# Patient Record
Sex: Female | Born: 1960 | Race: Black or African American | Hispanic: No | Marital: Single
Health system: Southern US, Community
[De-identification: ages and names within clinical notes are randomized; demographics above are authoritative.]

---

## 2001-03-30 ENCOUNTER — Encounter: Payer: Self-pay | Admitting: Family Medicine

## 2001-03-30 ENCOUNTER — Ambulatory Visit (HOSPITAL_COMMUNITY): Admission: RE | Admit: 2001-03-30 | Discharge: 2001-03-30 | Payer: Self-pay | Admitting: *Deleted

## 2001-06-07 ENCOUNTER — Inpatient Hospital Stay (HOSPITAL_COMMUNITY): Admission: AD | Admit: 2001-06-07 | Discharge: 2001-06-09 | Payer: Self-pay | Admitting: Family Medicine

## 2017-05-19 ENCOUNTER — Other Ambulatory Visit: Payer: Self-pay

## 2017-05-19 ENCOUNTER — Encounter: Payer: Self-pay | Admitting: Physical Therapy

## 2017-05-19 ENCOUNTER — Ambulatory Visit: Payer: Worker's Compensation | Attending: Specialist | Admitting: Physical Therapy

## 2017-05-19 DIAGNOSIS — R293 Abnormal posture: Secondary | ICD-10-CM

## 2017-05-19 DIAGNOSIS — M542 Cervicalgia: Secondary | ICD-10-CM | POA: Insufficient documentation

## 2017-05-19 NOTE — Therapy (Signed)
[facility] [address] [place], [place], [address] Phone: [phone]   Fax:  [phone]  Physical Therapy Evaluation  Patient Details  Name: Janice Davis MRN: [id number] Date of Birth: 1961-04-06 Referring Provider: Jene Every MD   Encounter Date: 05/19/2017  PT End of Session - 05/19/17 1123    Visit Number  1    Number of Visits  12    Date for PT Re-Evaluation  06/30/17    PT Start Time  1030    PT Stop Time  1103    PT Time Calculation (min)  33 min       History reviewed. No pertinent past medical history.  History reviewed. No pertinent surgical history.  There were no vitals filed for this visit.   Subjective Assessment - 05/19/17 1126    Subjective  The patient reports she was injured on the job on 01/10/17 when she was pushing a tube into a machine.  She states she was switched to another department and sustained a "re-injury" when she trhrew her arms up when rolling tubes.  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  She states this really exacerbated her pain-level and she has not really recovered since then.  She had some physical therapy and states it helped some.  She liked ultrasound but did not like electrical stimulation.  She has a TENS unit.  She is now on Gabapentin and this has been helpful.  Her resting pain-level is a 6/10 todat.  However, if she uses her left UE much her pain-level rises to a 10+/10.    Diagnostic tests  MRI:  Multiple level central stenosis.    Currently in Pain?  Yes    Pain Score  6     Pain Location  Neck    Pain Orientation  Left    Pain Descriptors / Indicators  Aching;Sharp    Pain Radiating Towards  Left shoulder.    Pain Onset  More than a month ago    Pain Frequency  Constant    Aggravating Factors   Left UE movement.    Pain Relieving Factors  Gabapentin.         [facility] PT Assessment - 05/19/17 0001       Assessment   Medical Diagnosis  Cervical Radiculopathy.    Referring Provider  Jene Every MD    Onset Date/Surgical Date  -- 01/10/17.    Hand Dominance  -- Per patient she is amidextrous      Precautions   Precautions  None      Restrictions   Weight Bearing Restrictions  No      Balance Screen   Has the patient fallen in the past 6 months  No    Has the patient had a decrease in activity level because of a fear of falling?   No    Is the patient reluctant to leave their home because of a fear of falling?   No      Home Environment   Living Environment  Private residence      Prior Function   Level of Independence  Independent      Posture/Postural Control   Posture/Postural Control  Postural limitations    Postural Limitations  Rounded Shoulders;Forward head    Posture Comments  Decreased cervical lordosis.      ROM / Strength   AROM / PROM / Strength  AROM;Strength      AROM   Overall AROM Comments  Right active cervcial rotation=48 degrees and left= 32 degrees.      Strength   Overall Strength Comments  Left ER painful and grossly graded at 4/5.      Palpation   Palpation comment  Tender just left of C5 over paraspinal musculature and very tender to palpation over the left UTeven to light palpation which is remarkable for a trigger point.      Special Tests    Special Tests  -- Normal bilateral UE DTR's.      Ambulation/Gait   Gait Comments  WNL.             Objective measurements completed on examination: See above findings.                   PT Long Term Goals - 05/19/17 1316      PT LONG TERM GOAL #1   Title  Independent with a HEP.    Time  6    Period  Weeks    Status  New      PT LONG TERM GOAL #2   Title  Increase active cervical rotation to 70 degrees+ so patient can turn head more easily while driving.    Time  6    Period  Weeks    Status  New      PT LONG TERM GOAL #3   Title  Perform ADL's with pain not >  2-3/10.    Time  6    Period  Weeks    Status  New             Plan - 05/19/17 1259    Clinical Impression Statement  The patient was orginally injured on the job on 01/10/17.  She then had 2 other incidences that caused further pain.  She has significant losses of cervical range of motion and is very to palpation at mid-cervical and left UT which is remarkable for a trigger point.  She also had reproducible pain with resisted left shoulder ER.  She is not able to return to work at this time.  patient will benefit from skilled physical therapy to include work simulation.    Clinical Presentation  Evolving    Clinical Presentation due to:  Not improving significantly.    Clinical Decision Making  Low    Rehab Potential  Good    PT Frequency  2x / week    PT Treatment/Interventions  ADLs/Self Care Home Management;Cryotherapy;Moist Heat;Ultrasound;Therapeutic exercise;Therapeutic activities;Patient/family education;Passive range of motion;Manual techniques;Dry needling    PT Next Visit Plan  U/S; gentle STW/M, active cervical range of motion; work simulation activites.      Consulted and Agree with Plan of Care  Patient       Patient will benefit from skilled therapeutic intervention in order to improve the following deficits and impairments:  Pain, Postural dysfunction, Decreased activity tolerance, Decreased range of motion, Decreased strength  Visit Diagnosis: Cervicalgia - Plan: PT plan of care cert/re-cert  Abnormal posture - Plan: PT plan of care cert/re-cert     Problem List There are no active problems to display for this patient.   Camar Guyton, [place]AD  MPT 05/19/2017, 1:19 PM  [facility] [address] [place], [place], [address] Phone: [phone]   Fax:  [phone]  Name: Janice Davis MRN: [id number] Date of Birth: 12-May-1960

## 2017-05-27 ENCOUNTER — Encounter: Payer: Self-pay | Admitting: Physical Therapy

## 2017-05-27 ENCOUNTER — Ambulatory Visit: Payer: Worker's Compensation | Attending: Specialist | Admitting: Physical Therapy

## 2017-05-27 DIAGNOSIS — R293 Abnormal posture: Secondary | ICD-10-CM

## 2017-05-27 DIAGNOSIS — M542 Cervicalgia: Secondary | ICD-10-CM | POA: Diagnosis present

## 2017-05-27 NOTE — Therapy (Signed)
[facility] [address] [place], [place], [address] Phone: [phone]   Fax:  [phone]  Physical Therapy Treatment  Patient Details  Name: Janice Davis MRN: [id number] Date of Birth: 01/26/1961 Referring Provider: Jene EveryJeffrey Beane MD   Encounter Date: 05/27/2017  PT End of Session - 05/27/17 0806    Visit Number  2    Number of Visits  12    Date for PT Re-Evaluation  06/30/17    PT Start Time  0730    PT Stop Time  0809    PT Time Calculation (min)  39 min    Activity Tolerance  Patient tolerated treatment well;Patient limited by pain    Behavior During Therapy  [facility] for tasks assessed/performed       History reviewed. No pertinent past medical history.  History reviewed. No pertinent surgical history.  There were no vitals filed for this visit.  Subjective Assessment - 05/27/17 0731    Subjective  Patient arrived with increased pain today and worked 12 hour shift    Diagnostic tests  MRI:  Multiple level central stenosis.    Currently in Pain?  Yes    Pain Score  10-Worst pain ever    Pain Location  Neck    Pain Orientation  Left    Pain Descriptors / Indicators  Aching;Sharp    Pain Type  Acute pain    Pain Onset  More than a month ago    Pain Frequency  Constant    Aggravating Factors   certain movements and work    Pain Relieving Factors  meds /heat                      [facility] Adult PT Treatment/Exercise - 05/27/17 0001      Modalities   Modalities  Ultrasound      Ultrasound   Ultrasound Location  bil cervical paraspnal and UT    Ultrasound Parameters  1.5w/cm2/50%/721mhz x5112min    Ultrasound Goals  Pain      Manual Therapy   Manual Therapy  Myofascial release;Soft tissue mobilization    Manual therapy comments  gentle manual STW and myofascial release to bil cervical paraspinals/UT/scapula                  PT Long Term Goals - 05/27/17 0809      PT LONG TERM GOAL #1   Title   Independent with a HEP.    Time  6    Period  Weeks    Status  On-going      PT LONG TERM GOAL #2   Title  Increase active cervical rotation to 70 degrees+ so patient can turn head more easily while driving.    Time  6    Period  Weeks    Status  On-going      PT LONG TERM GOAL #3   Title  Perform ADL's with pain not > 2-3/10.    Time  6    Period  Weeks    Status  On-going            Plan - 05/27/17 0809    Clinical Impression Statement  Patient tolerated fair today due to increased pain per reported. Patient reported working a 12 hour shift at work and unable to take her meds due to side effects. Patient limited with manual STW today and had reported palpable pain on bil cervical paraspinals into UT and down scapula  on bil sides. Patient reported feeling better after treatment. Current goals ongoing. Patient reported having therapy at another clinic yet was transferred here to try to decrease pain and increase function for ADL's, work and functional independence.     Rehab Potential  Good    PT Frequency  2x / week    PT Treatment/Interventions  ADLs/Self Care Home Management;Cryotherapy;Moist Heat;Ultrasound;Therapeutic exercise;Therapeutic activities;Patient/family education;Passive range of motion;Manual techniques;Dry needling    PT Next Visit Plan  cont with U/S; gentle STW/M, active cervical range of motion; work simulation activites.      Consulted and Agree with Plan of Care  Patient       Patient will benefit from skilled therapeutic intervention in order to improve the following deficits and impairments:  Pain, Postural dysfunction, Decreased activity tolerance, Decreased range of motion, Decreased strength  Visit Diagnosis: Cervicalgia  Abnormal posture     Problem List There are no active problems to display for this patient.   Hermelinda Dellen, PTA 05/27/2017, 8:19 AM  [facility] [address] [place], [place], [address] Phone: [phone]   Fax:  [phone]  Name: Janice Davis MRN: [id number] Date of Birth: Aug 14, 1960

## 2017-05-28 ENCOUNTER — Ambulatory Visit: Payer: Worker's Compensation | Admitting: Physical Therapy

## 2017-05-28 ENCOUNTER — Encounter: Payer: Self-pay | Admitting: Physical Therapy

## 2017-05-28 DIAGNOSIS — R293 Abnormal posture: Secondary | ICD-10-CM

## 2017-05-28 DIAGNOSIS — M542 Cervicalgia: Secondary | ICD-10-CM

## 2017-05-28 NOTE — Therapy (Signed)
[facility] [address] [place], [place], [address] Phone: [phone]   Fax:  [phone]  Physical Therapy Treatment  Patient Details  Name: Janice Davis MRN: [id number] Date of Birth: 01/31/1961 Referring Provider: Jene EveryJeffrey Beane MD   Encounter Date: 05/28/2017  PT End of Session - 05/28/17 0810    Visit Number  3    Number of Visits  12    Date for PT Re-Evaluation  06/30/17    PT Start Time  0731    PT Stop Time  0802    PT Time Calculation (min)  31 min    Activity Tolerance  Patient limited by pain;Patient tolerated treatment well    Behavior During Therapy  [facility] for tasks assessed/performed       History reviewed. No pertinent past medical history.  History reviewed. No pertinent surgical history.  There were no vitals filed for this visit.  Subjective Assessment - 05/28/17 0736    Subjective  Patient arrived and took medication and reported feeling better and less discomfort    Diagnostic tests  MRI:  Multiple level central stenosis.    Currently in Pain?  Yes    Pain Score  3     Pain Location  Neck    Pain Orientation  Left    Pain Descriptors / Indicators  Other (Comment) pinching    Pain Type  Acute pain    Pain Onset  More than a month ago    Pain Frequency  Constant    Aggravating Factors   work activities/use with left shoulder    Pain Relieving Factors  medication/rest                      [facility] Adult PT Treatment/Exercise - 05/28/17 0001      Exercises   Exercises  Shoulder      Shoulder Exercises: Standing   External Rotation  Strengthening;Left;20 reps;Theraband    Theraband Level (Shoulder External Rotation)  Level 1 (Yellow)    Other Standing Exercises  cone stacking x4 to waist over head      Shoulder Exercises: ROM/Strengthening   UBE (Upper Arm Bike)  x356min @ 120 RPM    Other ROM/Strengthening Exercises  Yellow thera putty given      Ultrasound   Ultrasound Location  bil  cervical paraspinals/UT    Ultrasound Parameters  1.5w/cm2/50%/841mhz x4512min    Ultrasound Goals  Pain      Manual Therapy   Manual Therapy  Myofascial release;Soft tissue mobilization    Manual therapy comments  attempted STW yet patient unable to tolerate x784min                  PT Long Term Goals - 05/27/17 0809      PT LONG TERM GOAL #1   Title  Independent with a HEP.    Time  6    Period  Weeks    Status  On-going      PT LONG TERM GOAL #2   Title  Increase active cervical rotation to 70 degrees+ so patient can turn head more easily while driving.    Time  6    Period  Weeks    Status  On-going      PT LONG TERM GOAL #3   Title  Perform ADL's with pain not > 2-3/10.    Time  6    Period  Weeks    Status  On-going  Plan - 05/28/17 3664    Clinical Impression Statement  Patient arrived with reported improvement due to taking medication. Today started work simulation exercises and patient had complaints of discomfort with backward movement on UBE and reaching overhead with left UE. Patient did well ultrasound yet unable to tolerate very gentle manual STW today. Yellow theraputty given. Goals ongoing due to reported pain.     Rehab Potential  Good    PT Frequency  2x / week    PT Treatment/Interventions  ADLs/Self Care Home Management;Cryotherapy;Moist Heat;Ultrasound;Therapeutic exercise;Therapeutic activities;Patient/family education;Passive range of motion;Manual techniques;Dry needling    PT Next Visit Plan  cont with U/S; gentle STW/M, active cervical range of motion; work simulation activites    Consulted and Agree with Plan of Care  Patient       Patient will benefit from skilled therapeutic intervention in order to improve the following deficits and impairments:  Pain, Postural dysfunction, Decreased activity tolerance, Decreased range of motion, Decreased strength  Visit Diagnosis: Cervicalgia  Abnormal posture     Problem  List There are no active problems to display for this patient.   Hermelinda Dellen, PTA 05/28/2017, 8:15 AM  [facility] [address] [place], [place], [address] Phone: [phone]   Fax:  [phone]  Name: Janice Davis MRN: [id number] Date of Birth: 07-Jun-1960

## 2017-06-02 ENCOUNTER — Ambulatory Visit: Payer: Worker's Compensation | Admitting: *Deleted

## 2017-06-02 ENCOUNTER — Encounter: Payer: Self-pay | Admitting: *Deleted

## 2017-06-02 DIAGNOSIS — M542 Cervicalgia: Secondary | ICD-10-CM

## 2017-06-02 DIAGNOSIS — R293 Abnormal posture: Secondary | ICD-10-CM

## 2017-06-02 NOTE — Therapy (Signed)
[facility] [address] [place], [place], [address] Phone: [phone]   Fax:  [phone]  Physical Therapy Treatment  Patient Details  Name: Janice ColonelWanda N Davis MRN: [id number] Date of Birth: 1960/12/15 Referring Provider: Jene EveryJeffrey Beane MD   Encounter Date: 06/02/2017  PT End of Session - 06/02/17 0852    Visit Number  4    Number of Visits  12    Date for PT Re-Evaluation  06/30/17    PT Start Time  0900    PT Stop Time  0946    PT Time Calculation (min)  46 min       History reviewed. No pertinent past medical history.  History reviewed. No pertinent surgical history.  There were no vitals filed for this visit.                   [facility] - 06/02/17 0001      Exercises   Exercises  Shoulder      Shoulder Exercises: Standing   External Rotation  Strengthening;Left;20 reps;Theraband;10 reps 3x10    Theraband Level (Shoulder External Rotation)  Level 1 (Yellow)    Extension  Strengthening;Left;Theraband 3x10    Row  Strengthening;Left    Other Standing Exercises  cone stacking x4 to waist over head    Other Standing Exercises  UE ranger flexion 3x10      Shoulder Exercises: ROM/Strengthening   UBE (Upper Arm Bike)  x786min @ 120 RPM      Modalities   Modalities  Ultrasound      Ultrasound   Ultrasound Location  Bil cerv. paras    Ultrasound Parameters  1.5 w/cm2 x 10 mins    Ultrasound Goals  Pain                  PT Long Term Goals - 05/27/17 0809      PT LONG TERM GOAL #1   Title  Independent with a HEP.    Time  6    Period  Weeks    Status  On-going      PT LONG TERM GOAL #2   Title  Increase active cervical rotation to 70 degrees+ so patient can turn head more easily while driving.    Time  6    Period  Weeks    Status  On-going      PT LONG TERM GOAL #3   Title  Perform ADL's with pain not > 2-3/10.    Time  6    Period  Weeks    Status  On-going            Plan - 06/02/17 [id number]    Clinical Impression Statement  Pt arrived today doing fair, but still hurting LT side of neck and into LT arm. Rx focused on Low level LT UE Exs, but pt did have pain and discomfort with all exs. Forward only on UBE due to pain. She did well with US and pain was decreased after RX.    Clinical Presentation  Evolving    Rehab Potential  Good    PT Frequency  2x / week    PT Treatment/Interventions  ADLs/Self Care Home Management;Cryotherapy;Moist Heat;Ultrasound;Therapeutic exercise;Therapeutic activities;Patient/family education;Passive range of motion;Manual techniques;Dry needling    PT Next Visit Plan  cont with U/S; gentle STW/M, active cervical range of motion; work simulation activites    Consulted and Agree with Plan of Care  Patient  Patient will benefit from skilled therapeutic intervention in order to improve the following deficits and impairments:  Pain, Postural dysfunction, Decreased activity tolerance, Decreased range of motion, Decreased strength  Visit Diagnosis: Cervicalgia  Abnormal posture     Problem List There are no active problems to display for this patient.   Mccrae Speciale,CHRIS, PTA 06/02/2017, 9:52 AM  [facility] [address] [place], [place], [address] Phone: [phone]   Fax:  [phone]  Name: Janice Davis MRN: [id number] Date of Birth: 1960-08-30

## 2017-06-03 ENCOUNTER — Ambulatory Visit: Payer: Worker's Compensation | Admitting: Physical Therapy

## 2017-06-03 ENCOUNTER — Encounter: Payer: Self-pay | Admitting: Physical Therapy

## 2017-06-03 DIAGNOSIS — M542 Cervicalgia: Secondary | ICD-10-CM

## 2017-06-03 DIAGNOSIS — R293 Abnormal posture: Secondary | ICD-10-CM

## 2017-06-03 NOTE — Therapy (Signed)
[facility] [address] [place], [place], [address] Phone: [phone]   Fax:  [phone]  Physical Therapy Treatment  Patient Details  Name: Janice Davis MRN: [id number] Date of Birth: Apr 16, 1961 Referring Provider: Jene EveryJeffrey Beane MD   Encounter Date: 06/03/2017  PT End of Session - 06/03/17 0937    Visit Number  5    Number of Visits  12    Date for PT Re-Evaluation  06/30/17    PT Start Time  0901    PT Stop Time  0941    PT Time Calculation (min)  40 min    Activity Tolerance  Patient tolerated treatment well    Behavior During Therapy  [facility] for tasks assessed/performed       History reviewed. No pertinent past medical history.  History reviewed. No pertinent surgical history.  There were no vitals filed for this visit.  Subjective Assessment - 06/03/17 0903    Subjective  Patient arrived and took medication and reported feeling better and less discomfort    Diagnostic tests  MRI:  Multiple level central stenosis.    Currently in Pain?  Yes    Pain Score  4     Pain Location  Neck    Pain Orientation  Left    Pain Descriptors / Indicators  Discomfort    Pain Type  Acute pain    Pain Onset  More than a month ago    Pain Frequency  Intermittent    Aggravating Factors   work activities using left shoulder    Pain Relieving Factors  meds and rest         [facility] PT Assessment - 06/03/17 0001      ROM / Strength   AROM / PROM / Strength  AROM      AROM   AROM Assessment Site  Cervical    Cervical - Right Rotation  53    Cervical - Left Rotation  50                  [facility] Adult PT Treatment/Exercise - 06/03/17 0001      Shoulder Exercises: Standing   Protraction  Strengthening;Left;20 reps;10 reps;Theraband    Theraband Level (Shoulder Protraction)  Level 1 (Yellow)    External Rotation  Strengthening;Left;10 reps;Theraband;20 reps    Theraband Level (Shoulder External Rotation)  Level 1 (Yellow)    Internal Rotation  Strengthening;Left;20 reps;10 reps;Theraband    Theraband Level (Shoulder Internal Rotation)  Level 1 (Yellow)    Retraction  Strengthening;Left;20 reps;10 reps;Theraband    Theraband Level (Shoulder Retraction)  Level 1 (Yellow)    Other Standing Exercises  cone stacking x4 to waist over head    Other Standing Exercises  UE ranger flexion 3x10      Shoulder Exercises: ROM/Strengthening   UBE (Upper Arm Bike)  x826min @ 120 RPM forward only due to discomfort backward    Other ROM/Strengthening Exercises  clothes pins on rope x8      Ultrasound   Ultrasound Location  bil cervical paraspinals    Ultrasound Parameters  1.5w/cm2/50%/721mhz x3210min    Ultrasound Goals  Pain                  PT Long Term Goals - 05/27/17 0809      PT LONG TERM GOAL #1   Title  Independent with a HEP.    Time  6    Period  Weeks    Status  On-going      PT LONG TERM GOAL #2   Title  Increase active cervical rotation to 70 degrees+ so patient can turn head more easily while driving.    Time  6    Period  Weeks    Status  On-going      PT LONG TERM GOAL #3   Title  Perform ADL's with pain not > 2-3/10.    Time  6    Period  Weeks    Status  On-going            Plan - 06/03/17 4098    Clinical Impression Statement  Patient tolerated treatment well today with little discomfort reported throughout treatment. Patient able to progress with work simulated exercises today. Patient able to complete all exercises with all reps. Patient has improved bil cervical rotation today. Patient reported less pain with meds and on work days she is unable to take meds therfore increasd discomfort. Goals progressing yet ongoing due to pain and ROM limitations.     Rehab Potential  Good    PT Frequency  2x / week    PT Treatment/Interventions  ADLs/Self Care Home Management;Cryotherapy;Moist Heat;Ultrasound;Therapeutic exercise;Therapeutic activities;Patient/family education;Passive range of  motion;Manual techniques;Dry needling    PT Next Visit Plan  cont with U/S; gentle STW/M, active cervical range of motion; work simulation activites    Consulted and Agree with Plan of Care  Patient       Patient will benefit from skilled therapeutic intervention in order to improve the following deficits and impairments:  Pain, Postural dysfunction, Decreased activity tolerance, Decreased range of motion, Decreased strength  Visit Diagnosis: Cervicalgia  Abnormal posture     Problem List There are no active problems to display for this patient.   Hermelinda Dellen, PTA 06/03/2017, 9:41 AM  [facility] [address] [place], [place], [address] Phone: [phone]   Fax:  [phone]  Name: Janice Davis MRN: [id number] Date of Birth: 26-Aug-1960

## 2017-06-10 ENCOUNTER — Ambulatory Visit: Payer: Worker's Compensation | Admitting: Physical Therapy

## 2017-06-10 ENCOUNTER — Encounter: Payer: Self-pay | Admitting: Physical Therapy

## 2017-06-10 DIAGNOSIS — M542 Cervicalgia: Secondary | ICD-10-CM | POA: Diagnosis not present

## 2017-06-10 DIAGNOSIS — R293 Abnormal posture: Secondary | ICD-10-CM

## 2017-06-10 NOTE — Therapy (Signed)
[facility] [address] [place], [place], [address] Phone: [phone]   Fax:  [phone]  Physical Therapy Treatment  Patient Details  Name: Janice Davis MRN: [id number] Date of Birth: Jan 30, 1961 Referring Provider: Jene EveryJeffrey Beane MD   Encounter Date: 06/10/2017  PT End of Session - 06/10/17 0939    Visit Number  6    Number of Visits  12    Date for PT Re-Evaluation  06/30/17    PT Start Time  0900    PT Stop Time  0941    PT Time Calculation (min)  41 min    Activity Tolerance  Patient tolerated treatment well    Behavior During Therapy  [facility] for tasks assessed/performed       History reviewed. No pertinent past medical history.  History reviewed. No pertinent surgical history.  There were no vitals filed for this visit.  Subjective Assessment - 06/10/17 0902    Subjective  Patient arrived and reported 4 days pain free then as of yesterday increased discomfort for unknown reason with little relief from meds    Diagnostic tests  MRI:  Multiple level central stenosis.    Currently in Pain?  Yes    Pain Score  3     Pain Location  Neck    Pain Orientation  Left    Pain Descriptors / Indicators  Discomfort;Numbness;Tingling    Pain Type  Acute pain    Pain Radiating Towards  down left shoulder    Pain Onset  More than a month ago    Pain Frequency  Intermittent    Aggravating Factors   work activities or using left shoulder    Pain Relieving Factors  meds and rest                      [facility] Adult PT Treatment/Exercise - 06/10/17 0001      Shoulder Exercises: Standing   Protraction  Strengthening;Left;20 reps;10 reps;Theraband    Theraband Level (Shoulder Protraction)  Level 1 (Yellow)    External Rotation  Strengthening;Left;10 reps;Theraband;20 reps    Theraband Level (Shoulder External Rotation)  Level 1 (Yellow)    Internal Rotation  Strengthening;Left;20 reps;10 reps;Theraband    Theraband Level  (Shoulder Internal Rotation)  Level 1 (Yellow)    Retraction  Strengthening;Left;20 reps;10 reps;Theraband    Theraband Level (Shoulder Retraction)  Level 1 (Yellow)    Other Standing Exercises  cone stacking x4 to waist over head    Other Standing Exercises  UE ranger flexion 3x10      Shoulder Exercises: ROM/Strengthening   UBE (Upper Arm Bike)  x516min @ 120 RPM forward only due to ongoing discomfort with backward motion    Wall Pushups  10 reps    Other ROM/Strengthening Exercises  clothes pins on rope x8      Ultrasound   Ultrasound Location  bil cervical paraspinals    Ultrasound Parameters  1.5w/cm2/50%/711mhz x5610min    Ultrasound Goals  Pain                  PT Long Term Goals - 05/27/17 0809      PT LONG TERM GOAL #1   Title  Independent with a HEP.    Time  6    Period  Weeks    Status  On-going      PT LONG TERM GOAL #2   Title  Increase active cervical rotation to 70 degrees+ so patient can  turn head more easily while driving.    Time  6    Period  Weeks    Status  On-going      PT LONG TERM GOAL #3   Title  Perform ADL's with pain not > 2-3/10.    Time  6    Period  Weeks    Status  On-going            Plan - 06/10/17 0941    Clinical Impression Statement  Patient tolerated treatment fair today with some reported numbness in left shoulder. Patient able to complete all exercises and progress slowly with new activity today. Patient continues to report difficulty with any prolong use of left UE which causes increased discomfort. Patient has F/U appt tomorrow with MD. Goals ongoing.     Rehab Potential  Good    PT Frequency  2x / week    PT Treatment/Interventions  ADLs/Self Care Home Management;Cryotherapy;Moist Heat;Ultrasound;Therapeutic exercise;Therapeutic activities;Patient/family education;Passive range of motion;Manual techniques;Dry needling    PT Next Visit Plan  cont with U/S; gentle STW/M, active cervical range of motion; work simulation  activites (MD. Jene Every appt tomorrow evening, and will need note tomorrow after appt)    Consulted and Agree with Plan of Care  Patient       Patient will benefit from skilled therapeutic intervention in order to improve the following deficits and impairments:  Pain, Postural dysfunction, Decreased activity tolerance, Decreased range of motion, Decreased strength  Visit Diagnosis: Cervicalgia  Abnormal posture     Problem List There are no active problems to display for this patient.   Hermelinda Dellen, PTA 06/10/2017, 9:49 AM  [facility] [address] [place], [place], [address] Phone: [phone]   Fax:  [phone]  Name: Janice Davis MRN: [id number] Date of Birth: Apr 13, 1961

## 2017-06-11 ENCOUNTER — Ambulatory Visit: Payer: Worker's Compensation | Admitting: Physical Therapy

## 2017-06-11 ENCOUNTER — Encounter: Payer: Self-pay | Admitting: Physical Therapy

## 2017-06-11 DIAGNOSIS — R293 Abnormal posture: Secondary | ICD-10-CM

## 2017-06-11 DIAGNOSIS — M542 Cervicalgia: Secondary | ICD-10-CM

## 2017-06-11 NOTE — Therapy (Addendum)
[facility] [place], [place], [address] Phone: [phone]   Fax:  [phone]  Physical Therapy Treatment  Patient Details  Name: Janice Davis MRN: [id number] Date of Birth: March 18, 1961 Referring Provider: Susa Day MD   Encounter Date: 06/11/2017  PT End of Session - 06/11/17 0934    Visit Number  7    Number of Visits  12    Date for PT Re-Evaluation  06/30/17    PT Start Time  0901    PT Stop Time  0941    PT Time Calculation (min)  40 min    Activity Tolerance  Patient tolerated treatment well    Behavior During Therapy  [facility] for tasks assessed/performed       History reviewed. No pertinent past medical history.  History reviewed. No pertinent surgical history.  There were no vitals filed for this visit.  Subjective Assessment - 06/11/17 0902    Subjective  Patient reported doing well today with some ongoing soeness in neck and left shoulder     Diagnostic tests  MRI:  Multiple level central stenosis.    Currently in Pain?  Yes    Pain Score  3     Pain Location  Neck    Pain Orientation  Left    Pain Descriptors / Indicators  Discomfort    Pain Type  Acute pain    Pain Radiating Towards  down left shoulder    Pain Onset  More than a month ago    Pain Frequency  Intermittent    Aggravating Factors   work activity or prolong use of left shoulder    Pain Relieving Factors  meds and rest         [facility] PT Assessment - 06/11/17 0001      AROM   AROM Assessment Site  Cervical    Cervical - Right Rotation  54    Cervical - Left Rotation  51                  [facility] Adult PT Treatment/Exercise - 06/11/17 0001      Exercises   Exercises  Shoulder;Neck      Neck Exercises: Standing   Neck Retraction  20 reps;Limitations    Neck Retraction Limitations  grey ball for resistance and scap retractions      Shoulder Exercises: Standing   Protraction  Strengthening;Left;20 reps;10  reps;Theraband    Theraband Level (Shoulder Protraction)  Level 1 (Yellow)    External Rotation  Strengthening;Left;10 reps;Theraband;20 reps    Theraband Level (Shoulder External Rotation)  Level 1 (Yellow)    Internal Rotation  Strengthening;Left;20 reps;10 reps;Theraband    Theraband Level (Shoulder Internal Rotation)  Level 1 (Yellow)    Retraction  Strengthening;Left;20 reps;10 reps;Theraband    Theraband Level (Shoulder Retraction)  Level 1 (Yellow)    Other Standing Exercises  cone stacking x 6 to waist over head    Other Standing Exercises  UE ranger flexion 3x10      Shoulder Exercises: ROM/Strengthening   UBE (Upper Arm Bike)  x84mn @ 120 RPM forward only due to discomfort     Wall Pushups  10 reps    Other ROM/Strengthening Exercises  clothes pins on rope x6 reps      Ultrasound   Ultrasound Location  left UT along inf shldr    Ultrasound Parameters  1.5w/cm2/50%/178m x8m52m   Ultrasound Goals  Pain  PT Long Term Goals - 05/27/17 0809      PT LONG TERM GOAL #1   Title  Independent with a HEP.    Time  6    Period  Weeks    Status  On-going      PT LONG TERM GOAL #2   Title  Increase active cervical rotation to 70 degrees+ so patient can turn head more easily while driving.    Time  6    Period  Weeks    Status  On-going      PT LONG TERM GOAL #3   Title  Perform ADL's with pain not > 2-3/10.    Time  6    Period  Weeks    Status  On-going            Plan - 06/11/17 0935    Clinical Impression Statement  Patient tolerated treatment well today. Patient progressing with cervical stabilization and left shoulder work simulated exercises today. Patient continues to have reffered symptoms down left UE with any prolong use of shoulder such as overhead prolong activity, driving or lifting. Improved active cervical rottion bil sides overll yet continues to have limitations. Patient current goals ongoing due to pain and ROM deficits.      Rehab Potential  Good    PT Frequency  2x / week    PT Treatment/Interventions  ADLs/Self Care Home Management;Cryotherapy;Moist Heat;Ultrasound;Therapeutic exercise;Therapeutic activities;Patient/family education;Passive range of motion;Manual techniques;Dry needling    PT Next Visit Plan  cont with U/S; gentle STW/M, active cervical range of motion; work simulation activites (MD. Susa Day appt tomorrow today note sent)    Consulted and Agree with Plan of Care  Patient       Patient will benefit from skilled therapeutic intervention in order to improve the following deficits and impairments:  Pain, Postural dysfunction, Decreased activity tolerance, Decreased range of motion, Decreased strength  Visit Diagnosis: Cervicalgia  Abnormal posture     Problem List There are no active problems to display for this patient.   Ladean Raya, PTA 06/11/17 9:42 AM  [facility] [place], [place], [address] Phone: [phone]   Fax:  [phone]  Name: Janice Davis MRN: [id number] Date of Birth: [birth year]  PHYSICAL THERAPY DISCHARGE SUMMARY  Visits from Start of Care: 7.  Current functional level related to goals / functional outcomes: See above.   Remaining deficits: See below.   Education / Equipment: HEP. Plan: Patient agrees to discharge.  Patient goals were not met. Patient is being discharged due to not returning since the last visit.  ?????         Mali Applegate MPT

## 2017-06-16 ENCOUNTER — Encounter: Payer: Self-pay | Admitting: Physical Therapy

## 2017-06-17 ENCOUNTER — Encounter: Payer: Self-pay | Admitting: Physical Therapy

## 2018-07-26 ENCOUNTER — Other Ambulatory Visit: Payer: Self-pay

## 2018-07-26 ENCOUNTER — Emergency Department (HOSPITAL_COMMUNITY)
Admission: EM | Admit: 2018-07-26 | Discharge: 2018-07-26 | Disposition: A | Discharge status: Home / Self Care | Payer: Medicaid Other | Attending: Emergency Medicine | Admitting: Emergency Medicine

## 2018-07-26 ENCOUNTER — Emergency Department (HOSPITAL_COMMUNITY): Payer: Medicaid Other

## 2018-07-26 ENCOUNTER — Encounter (HOSPITAL_COMMUNITY): Payer: Self-pay | Admitting: Emergency Medicine

## 2018-07-26 DIAGNOSIS — S161XXA Strain of muscle, fascia and tendon at neck level, initial encounter: Secondary | ICD-10-CM | POA: Diagnosis not present

## 2018-07-26 DIAGNOSIS — Y999 Unspecified external cause status: Secondary | ICD-10-CM | POA: Insufficient documentation

## 2018-07-26 DIAGNOSIS — Y92410 Unspecified street and highway as the place of occurrence of the external cause: Secondary | ICD-10-CM | POA: Diagnosis not present

## 2018-07-26 DIAGNOSIS — Y939 Activity, unspecified: Secondary | ICD-10-CM | POA: Insufficient documentation

## 2018-07-26 DIAGNOSIS — S0990XA Unspecified injury of head, initial encounter: Secondary | ICD-10-CM | POA: Insufficient documentation

## 2018-07-26 MED ORDER — METHOCARBAMOL 500 MG PO TABS: 500.0000 mg | ORAL_TABLET | Freq: Three times a day (TID) | ORAL | 0 refills | Status: AC | PRN

## 2018-07-26 NOTE — ED Notes (Addendum)
Pt's sister Janice Davis [phone].Pt's son Janice Davis [phone]. Pls contact with updates and D/C.
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
## 2018-07-26 NOTE — ED Notes (Signed)
Patient verbalizes understanding of discharge instructions. Opportunity for questioning and answers were provided. Armband removed by staff, pt discharged from ED.  

## 2018-07-26 NOTE — ED Notes (Signed)
Pt AO x 4. Moves all extremities.

## 2018-07-26 NOTE — Discharge Instructions (Signed)
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
